# Patient Record
Sex: Female | Born: 1995
Health system: Southern US, Community
[De-identification: ages and names within clinical notes are randomized; demographics above are authoritative.]

## PROBLEM LIST (undated history)

## (undated) DIAGNOSIS — J302 Other seasonal allergic rhinitis: Secondary | ICD-10-CM

## (undated) DIAGNOSIS — Z8701 Personal history of pneumonia (recurrent): Secondary | ICD-10-CM

---
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
## 2015-03-15 ENCOUNTER — Ambulatory Visit (INDEPENDENT_AMBULATORY_CARE_PROVIDER_SITE_OTHER): Payer: 59 | Admitting: Sports Medicine

## 2015-03-15 ENCOUNTER — Encounter: Payer: Self-pay | Admitting: Sports Medicine

## 2015-03-15 VITALS — BP 142/85 | HR 84 | Ht 67.0 in | Wt 187.0 lb

## 2015-03-15 DIAGNOSIS — R635 Abnormal weight gain: Secondary | ICD-10-CM

## 2015-03-15 DIAGNOSIS — Z6829 Body mass index (BMI) 29.0-29.9, adult: Secondary | ICD-10-CM | POA: Insufficient documentation

## 2015-03-15 MED ORDER — PHENTERMINE HCL 37.5 MG PO TABS: ORAL_TABLET | ORAL | Status: DC

## 2015-03-15 NOTE — Patient Instructions (Signed)
Goal heart rate is 160 beats per minute, keeping here for 30 minutes 5 times per week, may divide time between resistance training and aerobic exercise

## 2015-03-15 NOTE — Assessment & Plan Note (Signed)
Starting phentermine, nutrition referral. Next line return monthly for weight checks and refills, go weight is 140 Exercise prescription given

## 2015-03-15 NOTE — Progress Notes (Signed)
  Subjective:    CC: Obesity  HPI: This is a pleasant 19 year old female, she has tried dieting, exercise, and continues to have great difficulty losing weight. At this point she is amenable to trying pharmacologic intervention.  Past medical history, Surgical history, Family history not pertinant except as noted below, Social history, Allergies, and medications have been entered into the medical record, reviewed, and no changes needed.   Review of Systems: No fevers, chills, night sweats, weight loss, chest pain, or shortness of breath.   Objective:    General: Well Developed, well nourished, and in no acute distress.  Neuro: Alert and oriented x3, extra-ocular muscles intact, sensation grossly intact.  HEENT: Normocephalic, atraumatic, pupils equal round reactive to light, neck supple, no masses, no lymphadenopathy, thyroid nonpalpable.  Skin: Warm and dry, no rashes. Cardiac: Regular rate and rhythm, no murmurs rubs or gallops, no lower extremity edema.  Respiratory: Clear to auscultation bilaterally. Not using accessory muscles, speaking in full sentences.  Impression and Recommendations:    I spent 25 minutes with this patient, greater than 50% was face-to-face time counseling regarding the above diagnoses

## 2015-04-16 ENCOUNTER — Encounter: Payer: Self-pay | Admitting: Sports Medicine

## 2015-04-16 ENCOUNTER — Ambulatory Visit (INDEPENDENT_AMBULATORY_CARE_PROVIDER_SITE_OTHER): Payer: 59 | Admitting: Sports Medicine

## 2015-04-16 VITALS — BP 145/74 | HR 101 | Ht 67.0 in | Wt 178.0 lb

## 2015-04-16 DIAGNOSIS — R635 Abnormal weight gain: Secondary | ICD-10-CM

## 2015-04-16 MED ORDER — PHENTERMINE HCL 37.5 MG PO TABS: ORAL_TABLET | ORAL | Status: DC

## 2015-04-16 NOTE — Progress Notes (Signed)
  Subjective:    CC: follow-up  HPI: Weight gain: 10 pound weight loss after the first month on phentermine, has not yet started seeing the nutritionist, no side effects, happy with the results so far.  Past medical history, Surgical history, Family history not pertinant except as noted below, Social history, Allergies, and medications have been entered into the medical record, reviewed, and no changes needed.   Review of Systems: No fevers, chills, night sweats, weight loss, chest pain, or shortness of breath.   Objective:    General: Well Developed, well nourished, and in no acute distress.  Neuro: Alert and oriented x3, extra-ocular muscles intact, sensation grossly intact.  HEENT: Normocephalic, atraumatic, pupils equal round reactive to light, neck supple, no masses, no lymphadenopathy, thyroid nonpalpable.  Skin: Warm and dry, no rashes. Cardiac: Regular rate and rhythm, no murmurs rubs or gallops, no lower extremity edema.  Respiratory: Clear to auscultation bilaterally. Not using accessory muscles, speaking in full sentences.  Impression and Recommendations:

## 2015-04-16 NOTE — Assessment & Plan Note (Signed)
Fantastic weight loss. Return in one month for recheck and refills. This will be the second month.

## 2015-05-16 ENCOUNTER — Encounter: Payer: Self-pay | Admitting: Sports Medicine

## 2015-05-16 ENCOUNTER — Ambulatory Visit (INDEPENDENT_AMBULATORY_CARE_PROVIDER_SITE_OTHER): Payer: 59 | Admitting: Sports Medicine

## 2015-05-16 DIAGNOSIS — R635 Abnormal weight gain: Secondary | ICD-10-CM | POA: Diagnosis not present

## 2015-05-16 MED ORDER — TOPIRAMATE 50 MG PO TABS: ORAL_TABLET | ORAL | Status: DC

## 2015-05-16 MED ORDER — PHENTERMINE HCL 37.5 MG PO TABS: ORAL_TABLET | ORAL | Status: DC

## 2015-05-16 NOTE — Patient Instructions (Signed)
[facility] at [phone] and [phone]

## 2015-05-16 NOTE — Assessment & Plan Note (Signed)
Good continue weight loss after the second month on phentermine. I'm going to add Topamax and we continue phentermine as we enter the third month. Has not yet started with nutritionist.

## 2015-05-16 NOTE — Progress Notes (Signed)
  Subjective:    CC: Weight check  HPI: Obesity: 10 pounds the first month, 5 pounds this last month, did have some family stressors. Also started school. She did lose weight, and has no side effects and would like to continue the medication.   Past medical history, Surgical history, Family history not pertinant except as noted below, Social history, Allergies, and medications have been entered into the medical record, reviewed, and no changes needed.   Review of Systems: No fevers, chills, night sweats, weight loss, chest pain, or shortness of breath.   Objective:    General: Well Developed, well nourished, and in no acute distress.  Neuro: Alert and oriented x3, extra-ocular muscles intact, sensation grossly intact.  HEENT: Normocephalic, atraumatic, pupils equal round reactive to light, neck supple, no masses, no lymphadenopathy, thyroid nonpalpable.  Skin: Warm and dry, no rashes. Cardiac: Regular rate and rhythm, no murmurs rubs or gallops, no lower extremity edema.  Respiratory: Clear to auscultation bilaterally. Not using accessory muscles, speaking in full sentences.  Impression and Recommendations:

## 2015-06-03 ENCOUNTER — Telehealth: Payer: 59 | Admitting: Family

## 2015-06-03 DIAGNOSIS — R6889 Other general symptoms and signs: Secondary | ICD-10-CM | POA: Diagnosis not present

## 2015-06-03 NOTE — Progress Notes (Signed)

## 2015-06-06 ENCOUNTER — Telehealth: Payer: 59 | Admitting: Family

## 2015-06-06 ENCOUNTER — Encounter: Payer: Self-pay | Admitting: Sports Medicine

## 2015-06-06 DIAGNOSIS — J019 Acute sinusitis, unspecified: Secondary | ICD-10-CM

## 2015-06-06 MED ORDER — AZITHROMYCIN 250 MG PO TABS: ORAL_TABLET | ORAL | Status: DC

## 2015-06-06 MED ORDER — FLUTICASONE PROPIONATE 50 MCG/ACT NA SUSP: NASAL | Status: AC

## 2015-06-06 NOTE — Progress Notes (Signed)
We are sorry that you are not feeling well.  Here is how we plan to help!  Based on what you have shared with me it looks like you have sinusitis.  Sinusitis is inflammation and infection in the sinus cavities of the head.  Based on your presentation I believe you most likely have Acute Bacterial sinusitis.  This is an infection caused by bacteria and is treated with antibiotics.  I have prescribed Azithromyin 250 mg: two tables now and then one tablet daily for 4 additonal days  You may use an oral decongestant such as Mucinex D or if you have glaucoma or high blood pressure use plain Mucinex.  Saline nasal sprays help an can sefely be used as often as needed for congestion.  If you develop worsening sinus pain, fever or notice severe headache and vision changes, or if symptoms are not better after completion of antibiotic, please schedule an appointment with a health care provider.  Sinus infections are not as easily transmitted as other respiratory infection, however we still recommend that you avoid close contact with loved ones, especially the very young and elderly.  Remember to wash your hands thoroughly throughout the day as this is the number one way to prevent the spread of infection!  Home Care:  Only take medications as instructed by your medical team.  Complete the entire course of an antibiotic.  Do not take these medications with alcohol.  A steam or ultrasonic humidifier can help congestion.  You can place a towel over your head and breathe in the steam from hot water coming from a faucet.  Avoid close contacts especially the very young and the elderly.  Cover your mouth when you cough or sneeze.  Always remember to wash your hands.  Get Help Right Away If:  You develop worsening fever or sinus pain.  You develop a severe head ache or visual changes.  Your symptoms persist after you have completed your treatment plan.  Make sure you  Understand these  instructions.  Will watch your condition.  Will get help right away if you are not doing well or get worse.  Your e-visit answers were reviewed by a board certified advanced clinical practitioner to complete your personal care plan.  Depending on the condition, your plan could have included both over the counter or prescription medications.  If there is a problem please reply  once you have received a response from your provider.  Your safety is important to us.  If you have drug allergies check your prescription carefully.    You can use MyChart to ask questions about today's visit, request a non-urgent call back, or ask for a work or school excuse.  You will get an e-mail in the next two days asking about your experience.  I hope that your e-visit has been valuable and will speed your recovery. Thank you for using e-visits.        

## 2015-06-13 ENCOUNTER — Encounter: Payer: Self-pay | Admitting: Sports Medicine

## 2015-06-13 ENCOUNTER — Ambulatory Visit (INDEPENDENT_AMBULATORY_CARE_PROVIDER_SITE_OTHER): Payer: 59 | Admitting: Sports Medicine

## 2015-06-13 VITALS — BP 128/76 | HR 76 | Ht 67.0 in | Wt 172.0 lb

## 2015-06-13 DIAGNOSIS — R635 Abnormal weight gain: Secondary | ICD-10-CM | POA: Diagnosis not present

## 2015-06-13 MED ORDER — PHENTERMINE HCL 37.5 MG PO TABS: ORAL_TABLET | ORAL | Status: DC

## 2015-06-13 MED ORDER — TOPIRAMATE 100 MG PO TABS: 100.0000 mg | ORAL_TABLET | Freq: Every day | ORAL | Status: DC

## 2015-06-13 NOTE — Assessment & Plan Note (Signed)
Minimal weight loss, we are entering the third month, refilling phentermine, Topamax, we will add Saxenda if insufficient weight loss by the next month.

## 2015-06-13 NOTE — Progress Notes (Signed)
  Subjective:    CC: weight check  HPI: After 2 months of phentermine she has only lost an additional pound, she is taking Topamax 50 mg daily. This point she wants to try single additional month before considering Saxenda. She did have an episode of sinusitis and blames this for her insufficient weight loss.  Past medical history, Surgical history, Family history not pertinant except as noted below, Social history, Allergies, and medications have been entered into the medical record, reviewed, and no changes needed.   Review of Systems: No fevers, chills, night sweats, weight loss, chest pain, or shortness of breath.   Objective:    General: Well Developed, well nourished, and in no acute distress.  Neuro: Alert and oriented x3, extra-ocular muscles intact, sensation grossly intact.  HEENT: Normocephalic, atraumatic, pupils equal round reactive to light, neck supple, no masses, no lymphadenopathy, thyroid nonpalpable.  Skin: Warm and dry, no rashes. Cardiac: Regular rate and rhythm, no murmurs rubs or gallops, no lower extremity edema.  Respiratory: Clear to auscultation bilaterally. Not using accessory muscles, speaking in full sentences.  Impression and Recommendations:

## 2015-07-15 ENCOUNTER — Ambulatory Visit: Payer: 59 | Admitting: Sports Medicine

## 2015-07-24 ENCOUNTER — Ambulatory Visit (INDEPENDENT_AMBULATORY_CARE_PROVIDER_SITE_OTHER): Payer: 59 | Admitting: Sports Medicine

## 2015-07-24 VITALS — BP 130/81 | HR 69 | Temp 97.9°F | Resp 18 | Wt 167.9 lb

## 2015-07-24 DIAGNOSIS — R635 Abnormal weight gain: Secondary | ICD-10-CM

## 2015-07-24 MED ORDER — PHENTERMINE HCL 37.5 MG PO TABS: ORAL_TABLET | ORAL | Status: DC

## 2015-07-24 MED ORDER — TOPIRAMATE 100 MG PO TABS: 150.0000 mg | ORAL_TABLET | Freq: Every day | ORAL | Status: DC

## 2015-07-24 NOTE — Assessment & Plan Note (Signed)
5 pound weight loss after the last month, we have not done a full 3 months of phentermine. Increasing Topamax to 100 mg, refilling phentermine, if less than 7-10 pounds of weight loss at the next month we will add Saxenda.

## 2015-07-24 NOTE — Progress Notes (Signed)
  Subjective:    CC: Weight check  HPI: After 3 months of phentermine she has lost an additional 5 pounds since last month, currently doing 100 mg of Topamax as well. No side effects, she does not yet want to start Saxenda.  Past medical history, Surgical history, Family history not pertinant except as noted below, Social history, Allergies, and medications have been entered into the medical record, reviewed, and no changes needed.   Review of Systems: No fevers, chills, night sweats, weight loss, chest pain, or shortness of breath.   Objective:    General: Well Developed, well nourished, and in no acute distress.  Neuro: Alert and oriented x3, extra-ocular muscles intact, sensation grossly intact.  HEENT: Normocephalic, atraumatic, pupils equal round reactive to light, neck supple, no masses, no lymphadenopathy, thyroid nonpalpable.  Skin: Warm and dry, no rashes. Cardiac: Regular rate and rhythm, no murmurs rubs or gallops, no lower extremity edema.  Respiratory: Clear to auscultation bilaterally. Not using accessory muscles, speaking in full sentences.  Impression and Recommendations:

## 2015-08-21 ENCOUNTER — Ambulatory Visit (INDEPENDENT_AMBULATORY_CARE_PROVIDER_SITE_OTHER): Payer: 59 | Admitting: Sports Medicine

## 2015-08-21 DIAGNOSIS — R635 Abnormal weight gain: Secondary | ICD-10-CM

## 2015-08-21 MED ORDER — PHENTERMINE HCL 37.5 MG PO TABS: ORAL_TABLET | ORAL | Status: DC

## 2015-08-21 MED ORDER — LIRAGLUTIDE -WEIGHT MANAGEMENT 18 MG/3ML ~~LOC~~ SOPN: 3.0000 mg | PEN_INJECTOR | Freq: Every day | SUBCUTANEOUS | Status: DC

## 2015-08-21 MED ORDER — TOPIRAMATE 100 MG PO TABS: 150.0000 mg | ORAL_TABLET | Freq: Every day | ORAL | Status: DC

## 2015-08-21 NOTE — Assessment & Plan Note (Signed)
Insufficient weight loss. Starting Saxenda, return in a nurse visit to learn injections, refilling phentermine and Topamax, we're entering the fourth month

## 2015-08-21 NOTE — Progress Notes (Signed)
  Subjective:    CC: Abnormal weight gain  HPI: This is a pleasant 20 year old female, after 3 months of phentermine she's lost some weight, but unfortunately has not lost any weight since the last visit. At the last visit we discussed adding Saxenda. She is agreeable to do this now.  Past medical history, Surgical history, Family history not pertinant except as noted below, Social history, Allergies, and medications have been entered into the medical record, reviewed, and no changes needed.   Review of Systems: No fevers, chills, night sweats, weight loss, chest pain, or shortness of breath.   Objective:    General: Well Developed, well nourished, and in no acute distress.  Neuro: Alert and oriented x3, extra-ocular muscles intact, sensation grossly intact.  HEENT: Normocephalic, atraumatic, pupils equal round reactive to light, neck supple, no masses, no lymphadenopathy, thyroid nonpalpable.  Skin: Warm and dry, no rashes. Cardiac: Regular rate and rhythm, no murmurs rubs or gallops, no lower extremity edema.  Respiratory: Clear to auscultation bilaterally. Not using accessory muscles, speaking in full sentences.  Impression and Recommendations:

## 2015-08-22 MED FILL — PHENTERMINE 37.5 MG TABLET: 37.5 | 30 days supply | Qty: 30 | Fill #0

## 2015-08-22 MED FILL — TOPIRAMATE 100 MG TABLET: 100 | 30 days supply | Qty: 45 | Fill #0

## 2015-08-30 ENCOUNTER — Telehealth: Payer: Self-pay | Admitting: Sports Medicine

## 2015-08-30 NOTE — Telephone Encounter (Signed)
Received fax for prior authorization on Saxenda sent through cover my meds waiting on authorization. - CF °

## 2015-09-01 ENCOUNTER — Encounter: Payer: Self-pay | Admitting: Sports Medicine

## 2015-09-02 MED FILL — SAXENDA 18 MG/3 ML PEN: 18 | 30 days supply | Qty: 15 | Fill #0

## 2015-09-02 NOTE — Telephone Encounter (Signed)
Received fax from [employer] and Bernie Covey has been approved from 08/30/2015 - 12/28/2015. Case Number [id number]. Pharmacy has been notified. - CF

## 2015-09-03 MED FILL — NOVOFINE 30 NEEDLES: 30G X 8 MM | 90 days supply | Qty: 100 | Fill #0

## 2015-09-19 ENCOUNTER — Ambulatory Visit: Payer: 59 | Admitting: Sports Medicine

## 2015-09-20 ENCOUNTER — Telehealth: Payer: 59 | Admitting: Family

## 2015-09-20 DIAGNOSIS — A084 Viral intestinal infection, unspecified: Secondary | ICD-10-CM | POA: Diagnosis not present

## 2015-09-20 NOTE — Progress Notes (Signed)
We are sorry that you are not feeling well.  Here is how we plan to help!  Based on what you have shared with me it looks like you have Acute Infectious Diarrhea.  Most cases of acute diarrhea are due to infections with virus and bacteria and are self-limited conditions lasting less than 14 days.  For your symptoms you may take Imodium 2 mg tablets that are over the counter at your local pharmacy. Take two tablet now and then one after each loose stool up to 6 a day.  Antibiotics are not needed for most people with diarrhea.   HOME CARE  We recommend changing your diet to help with your symptoms for the next few days.  Drink plenty of fluids that contain water salt and sugar. Sports drinks such as Gatorade may help.   You may try broths, soups, bananas, applesauce, soft breads, mashed potatoes or crackers.   You are considered infectious for as long as the diarrhea continues. Hand washing or use of alcohol based hand sanitizers is recommend.  It is best to stay out of work or school until your symptoms stop.   GET HELP RIGHT AWAY  If you have dark yellow colored urine or do not pass urine frequently you should drink more fluids.    If your symptoms worsen   If you feel like you are going to pass out (faint)  You have a new problem  MAKE SURE YOU   Understand these instructions.  Will watch your condition.  Will get help right away if you are not doing well or get worse.  Your e-visit answers were reviewed by a board certified advanced clinical practitioner to complete your personal care plan.  Depending on the condition, your plan could have included both over the counter or prescription medications.  If there is a problem please reply  once you have received a response from your provider.  Your safety is important to us.  If you have drug allergies check your prescription carefully.    You can use MyChart to ask questions about today's visit, request a non-urgent call  back, or ask for a work or school excuse for 24 hours related to this e-Visit. If it has been greater than 24 hours you will need to follow up with your provider, or enter a new e-Visit to address those concerns.   You will get an e-mail in the next two days asking about your experience.  I hope that your e-visit has been valuable and will speed your recovery. Thank you for using e-visits.  

## 2015-09-26 ENCOUNTER — Ambulatory Visit (INDEPENDENT_AMBULATORY_CARE_PROVIDER_SITE_OTHER): Payer: 59 | Admitting: Sports Medicine

## 2015-09-26 ENCOUNTER — Encounter: Payer: Self-pay | Admitting: Sports Medicine

## 2015-09-26 DIAGNOSIS — R635 Abnormal weight gain: Secondary | ICD-10-CM | POA: Diagnosis not present

## 2015-09-26 MED ORDER — ONDANSETRON 8 MG PO TBDP: 8.0000 mg | ORAL_TABLET | Freq: Three times a day (TID) | ORAL | Status: DC | PRN

## 2015-09-26 MED ORDER — TOPIRAMATE 100 MG PO TABS: 150.0000 mg | ORAL_TABLET | Freq: Every day | ORAL | Status: DC

## 2015-09-26 MED ORDER — PHENTERMINE HCL 37.5 MG PO TABS: ORAL_TABLET | ORAL | Status: DC

## 2015-09-26 MED FILL — ONDANSETRON ODT 8 MG TABLET: 8 | 7 days supply | Qty: 20 | Fill #0

## 2015-09-26 NOTE — Progress Notes (Signed)
  Subjective:    CC:  Weight check  HPI: Rebecca Hawkins returns, she is now finishing 5 months of phentermine, Saxenda, Topamax, she is having severe nausea with the Saxenda. She has not lost any weight but attributes this to increasing stress and exams.  Past medical history, Surgical history, Family history not pertinant except as noted below, Social history, Allergies, and medications have been entered into the medical record, reviewed, and no changes needed.   Review of Systems: No fevers, chills, night sweats, weight loss, chest pain, or shortness of breath.   Objective:    General: Well Developed, well nourished, and in no acute distress.  Neuro: Alert and oriented x3, extra-ocular muscles intact, sensation grossly intact.  HEENT: Normocephalic, atraumatic, pupils equal round reactive to light, neck supple, no masses, no lymphadenopathy, thyroid nonpalpable.  Skin: Warm and dry, no rashes. Cardiac: Regular rate and rhythm, no murmurs rubs or gallops, no lower extremity edema.  Respiratory: Clear to auscultation bilaterally. Not using accessory muscles, speaking in full sentences.  Impression and Recommendations:

## 2015-09-26 NOTE — Addendum Note (Signed)
Addended by: Baird Kay on: 09/26/2015 04:20 PM   Modules accepted: Medications

## 2015-09-26 NOTE — Assessment & Plan Note (Signed)
Refilling phentermine, Topamax, continue Saxenda. Adding Zofran for nausea.

## 2015-10-02 MED FILL — PHENTERMINE 37.5 MG TABLET: 37.5 | 30 days supply | Qty: 30 | Fill #0

## 2015-10-24 ENCOUNTER — Ambulatory Visit: Payer: 59 | Admitting: Sports Medicine

## 2015-10-31 ENCOUNTER — Ambulatory Visit: Payer: 59 | Admitting: Sports Medicine

## 2015-11-07 ENCOUNTER — Ambulatory Visit (INDEPENDENT_AMBULATORY_CARE_PROVIDER_SITE_OTHER): Payer: 59 | Admitting: Sports Medicine

## 2015-11-07 ENCOUNTER — Encounter: Payer: Self-pay | Admitting: Sports Medicine

## 2015-11-07 VITALS — BP 153/83 | HR 97 | Ht 67.0 in | Wt 169.0 lb

## 2015-11-07 DIAGNOSIS — Z6827 Body mass index (BMI) 27.0-27.9, adult: Secondary | ICD-10-CM

## 2015-11-07 DIAGNOSIS — R740 Nonspecific elevation of levels of transaminase and lactic acid dehydrogenase [LDH]: Secondary | ICD-10-CM

## 2015-11-07 DIAGNOSIS — R7401 Elevation of levels of liver transaminase levels: Secondary | ICD-10-CM

## 2015-11-07 MED ORDER — NALTREXONE-BUPROPION HCL ER 8-90 MG PO TB12: ORAL_TABLET | ORAL | Status: DC

## 2015-11-07 MED ORDER — NALTREXONE-BUPROPION HCL ER 8-90 MG PO TB12: 2.0000 | ORAL_TABLET | Freq: Two times a day (BID) | ORAL | Status: DC

## 2015-11-07 NOTE — Progress Notes (Signed)
  Subjective:    CC: Follow-up  HPI: Overweight: After 5 months she has plateaued in terms of her weight loss on phentermine, Saxenda, Topamax, agreeable to discontinuing try something else.  Past medical history, Surgical history, Family history not pertinant except as noted below, Social history, Allergies, and medications have been entered into the medical record, reviewed, and no changes needed.   Review of Systems: No fevers, chills, night sweats, weight loss, chest pain, or shortness of breath.   Objective:    General: Well Developed, well nourished, and in no acute distress.  Neuro: Alert and oriented x3, extra-ocular muscles intact, sensation grossly intact.  HEENT: Normocephalic, atraumatic, pupils equal round reactive to light, neck supple, no masses, no lymphadenopathy, thyroid nonpalpable.  Skin: Warm and dry, no rashes. Cardiac: Regular rate and rhythm, no murmurs rubs or gallops, no lower extremity edema.  Respiratory: Clear to auscultation bilaterally. Not using accessory muscles, speaking in full sentences.  Impression and Recommendations:

## 2015-11-07 NOTE — Assessment & Plan Note (Signed)
Insufficient response to phentermine, Topamax, Saxenda together. Discontinuing all of the above and switching to Contrave, return in 2 months.

## 2015-11-07 NOTE — Assessment & Plan Note (Signed)
Trended down, no further intervention needed.

## 2015-11-12 ENCOUNTER — Encounter: Payer: Self-pay | Admitting: Sports Medicine

## 2015-11-13 ENCOUNTER — Telehealth: Payer: Self-pay | Admitting: *Deleted

## 2015-11-13 NOTE — Telephone Encounter (Signed)
Initiated PA for [employer]

## 2015-11-14 NOTE — Telephone Encounter (Signed)
contrave denied patient's BMI is 26.denial letter in providers box

## 2015-11-14 NOTE — Telephone Encounter (Signed)
Resubmit and use her vital signs from 03/15/2015. She has also developed patellofemoral syndrome and steatohepatitis due to excessive weight which can serve as confounding factors to allow coverage for a BMI less than 30 but greater than 27. Tell her not to pick up the 2 separate medications that we called in, and to try and get the contrave.

## 2015-11-19 NOTE — Telephone Encounter (Signed)
unfortunately Im not going to be able to do that. If you have any other concerns please see me

## 2015-11-20 NOTE — Telephone Encounter (Signed)
Ok will do. Called patient and left a voicemail

## 2015-11-20 NOTE — Telephone Encounter (Signed)
OK then just have her pick up the 2 separate rx's.

## 2015-11-22 ENCOUNTER — Encounter: Payer: Self-pay | Admitting: Sports Medicine

## 2016-01-02 ENCOUNTER — Ambulatory Visit: Payer: 59 | Admitting: Sports Medicine

## 2016-02-15 IMAGING — DX DG KNEE COMPLETE 4+V*R*
2 series · 2 of 2 positions shown · non-contrast
Comparison: None

CLINICAL DATA: Patellofemoral syndrome both knees

EXAM:
RIGHT KNEE - COMPLETE 4+ VIEW

[knee lat]
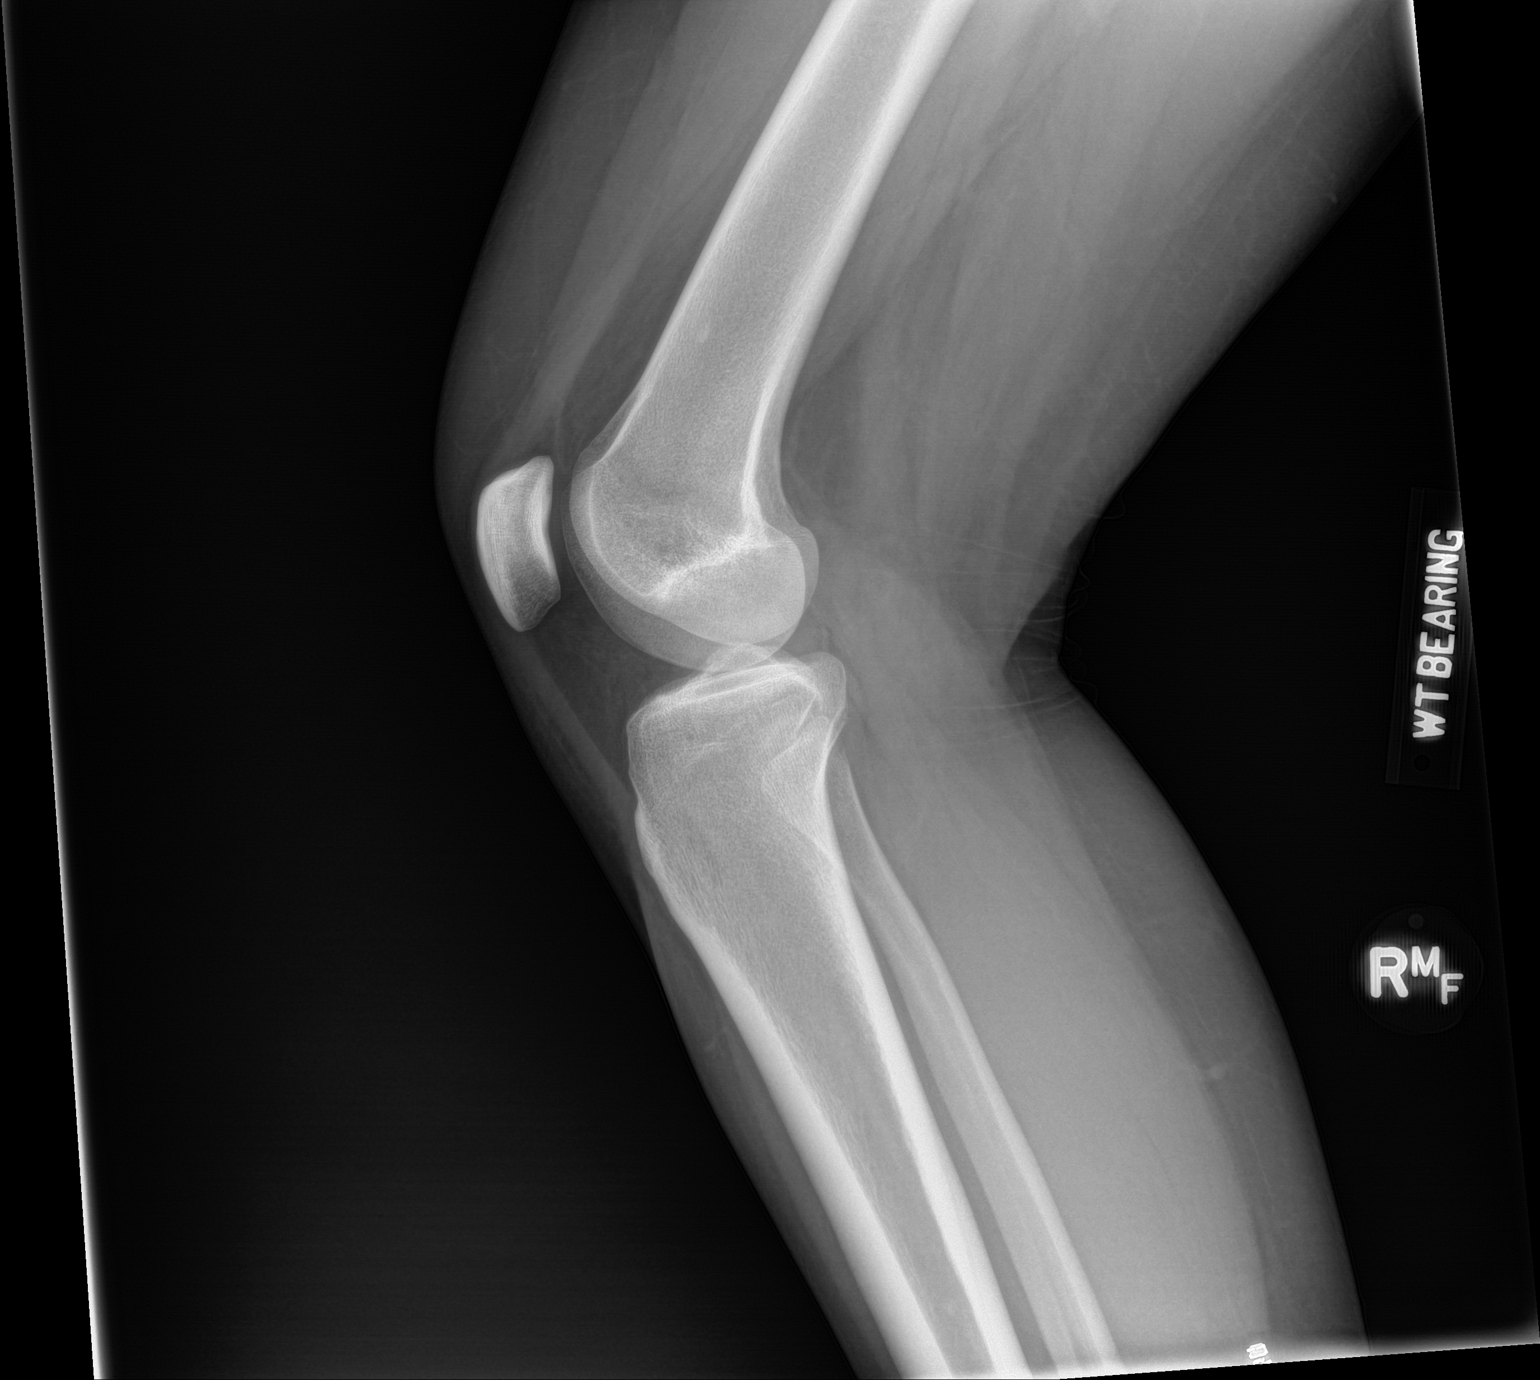

[patella skyline]
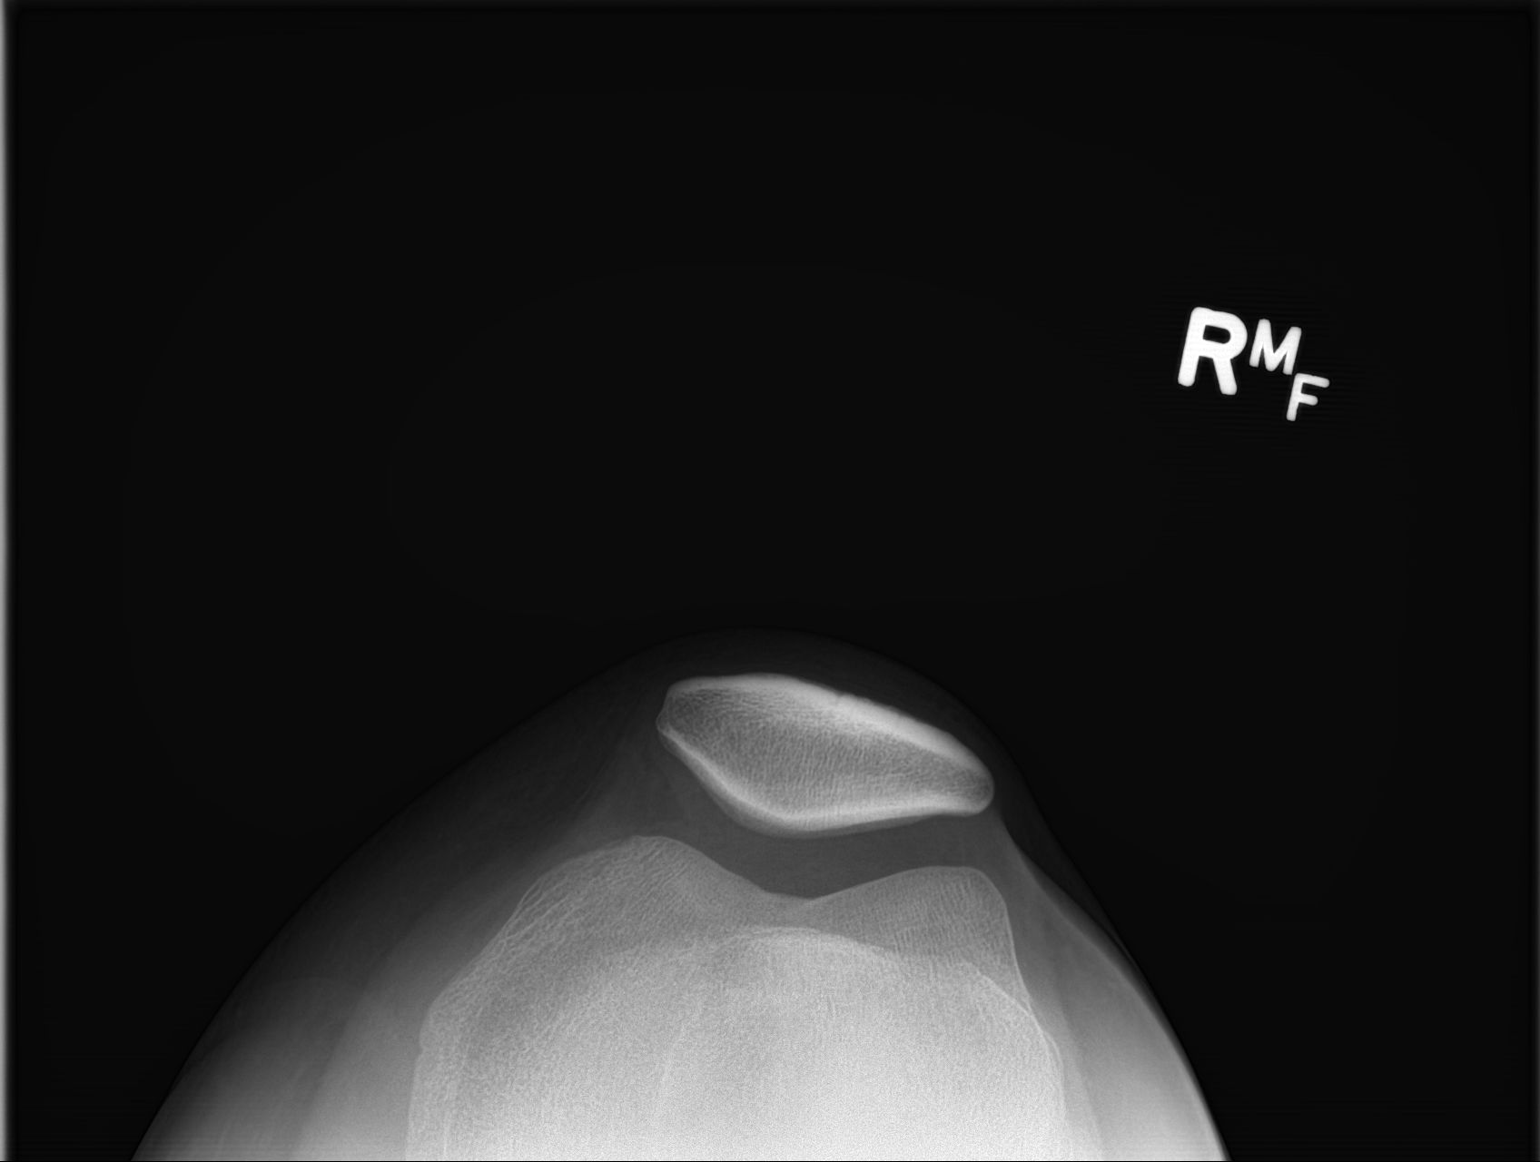

[2 of 2 positions shown; findings below may reference images not displayed]

FINDINGS: Osseous mineralization normal.

Joint spaces preserved.

No acute fracture, dislocation or bone destruction.

Patellofemoral alignment appears grossly normal.

No knee joint effusion.
IMPRESSION: Normal exam.

## 2016-12-15 ENCOUNTER — Ambulatory Visit (INDEPENDENT_AMBULATORY_CARE_PROVIDER_SITE_OTHER): Payer: 59 | Admitting: Sports Medicine

## 2016-12-15 ENCOUNTER — Encounter: Payer: Self-pay | Admitting: Sports Medicine

## 2016-12-15 DIAGNOSIS — Z0000 Encounter for general adult medical examination without abnormal findings: Secondary | ICD-10-CM | POA: Diagnosis not present

## 2016-12-15 DIAGNOSIS — F411 Generalized anxiety disorder: Secondary | ICD-10-CM | POA: Diagnosis not present

## 2016-12-15 DIAGNOSIS — Z6829 Body mass index (BMI) 29.0-29.9, adult: Secondary | ICD-10-CM

## 2016-12-15 DIAGNOSIS — H6123 Impacted cerumen, bilateral: Secondary | ICD-10-CM

## 2016-12-15 MED ORDER — PHENTERMINE-TOPIRAMATE ER 3.75-23 MG PO CP24: 1.0000 | ORAL_CAPSULE | Freq: Every morning | ORAL | 0 refills | Status: DC

## 2016-12-15 MED ORDER — BUSPIRONE HCL 7.5 MG PO TABS: 7.5000 mg | ORAL_TABLET | Freq: Three times a day (TID) | ORAL | 3 refills | Status: DC

## 2016-12-15 MED ORDER — PHENTERMINE-TOPIRAMATE ER 7.5-46 MG PO CP24: 1.0000 | ORAL_CAPSULE | Freq: Every morning | ORAL | 0 refills | Status: DC

## 2016-12-15 NOTE — Assessment & Plan Note (Signed)
Starting buspirone 7.5 twice a day, return in one month for a PHQ9 and GAD7.

## 2016-12-15 NOTE — Assessment & Plan Note (Addendum)
Patient is heavy enough now, adding Qsymia. Also has comorbidities of knee pain and elevated blood pressure on several occasions. Patient was counseled on 1500 calorie intake and exercise 3-4 times a week.

## 2016-12-15 NOTE — Assessment & Plan Note (Addendum)
Unremarkable physical, checking routine blood work, return in one year for this.

## 2016-12-15 NOTE — Progress Notes (Signed)
  Subjective:    CC: Annual physical  HPI:  Obesity: Has gained some more weight, desires to retry getting weight loss medication approved.  Anxiety:  Moderate to severe symptoms, would like to try medication. No suicidal or homicidal ideation.  Past medical history:  Negative.  See flowsheet/record as well for more information.  Surgical history: Negative.  See flowsheet/record as well for more information.  Family history: Negative.  See flowsheet/record as well for more information.  Social history: Negative.  See flowsheet/record as well for more information.  Allergies, and medications have been entered into the medical record, reviewed, and no changes needed.    Review of Systems: No headache, visual changes, nausea, vomiting, diarrhea, constipation, dizziness, abdominal pain, skin rash, fevers, chills, night sweats, swollen lymph nodes, weight loss, chest pain, body aches, joint swelling, muscle aches, shortness of breath, mood changes, visual or auditory hallucinations.  Objective:    General: Well Developed, well nourished, and in no acute distress.  Neuro: Alert and oriented x3, extra-ocular muscles intact, sensation grossly intact. Cranial nerves II through XII are intact, motor, sensory, and coordinative functions are all intact. HEENT: Normocephalic, atraumatic, pupils equal round reactive to light, neck supple, no masses, no lymphadenopathy, thyroid nonpalpable. Oropharynx, nasopharynx, external ear canals are occluded with cerumen  Skin: Warm and dry, no rashes noted.  Cardiac: Regular rate and rhythm, no murmurs rubs or gallops.  Respiratory: Clear to auscultation bilaterally. Not using accessory muscles, speaking in full sentences.  Abdominal: Soft, nontender, nondistended, positive bowel sounds, no masses, no organomegaly.  Musculoskeletal: Shoulder, elbow, wrist, hip, knee, ankle stable, and with full range of motion.  Indication: Cerumen impaction of the  ear(s) Medical necessity statement: On physical examination, cerumen impairs clinically significant portions of the external auditory canal, and tympanic membrane. Noted obstructive, copious cerumen that cannot be removed without irrigation Consent: Discussed benefits and risks of procedure and verbal consent obtained Procedure: Patient was prepped for the procedure. Utilized an otoscope to assess and take note of the ear canal, the tympanic membrane, and the presence, amount, and placement of the cerumen. Gentle water irrigation was utilized to remove cerumen.  Post procedure examination: shows cerumen was completely removed. Patient tolerated procedure well. The patient is made aware that they may experience temporary vertigo, temporary hearing loss, and temporary discomfort. If these symptom last for more than 24 hours to call the clinic or proceed to the ED.  Impression and Recommendations:    The patient was counselled, risk factors were discussed, anticipatory guidance given.  Annual physical exam Unremarkable physical, checking routine blood work, return in one year for this.  BMI 29.0-29.9,adult Patient is heavy enough now, adding Qsymia. Also has comorbidities of knee pain and elevated blood pressure on several occasions.  Generalized anxiety disorder Starting buspirone 7.5 twice a day, return in one month for a PHQ9 and GAD7.

## 2016-12-16 ENCOUNTER — Encounter: Payer: Self-pay | Admitting: Sports Medicine

## 2016-12-16 LAB — CBC
HCT: 42.9 % (ref 35.0–45.0)
Hemoglobin: 13.8 g/dL (ref 11.7–15.5)
MCH: 28.5 pg (ref 27.0–33.0)
MCHC: 32.2 g/dL (ref 32.0–36.0)
MCV: 88.5 fL (ref 80.0–100.0)
MPV: 10.4 fL (ref 7.5–12.5)
Platelets: 383 K/uL (ref 140–400)
RBC: 4.85 MIL/uL (ref 3.80–5.10)
RDW: 13.5 % (ref 11.0–15.0)
WBC: 7.6 10*3/uL (ref 3.8–10.8)

## 2016-12-16 LAB — COMPREHENSIVE METABOLIC PANEL
ALT: 12 U/L (ref 6–29)
AST: 14 U/L (ref 10–30)
Albumin: 4.5 g/dL (ref 3.6–5.1)
Alkaline Phosphatase: 72 U/L (ref 33–115)
CO2: 22 mmol/L (ref 20–31)
Calcium: 9.8 mg/dL (ref 8.6–10.2)
Creat: 0.88 mg/dL (ref 0.50–1.10)
Glucose, Bld: 80 mg/dL (ref 65–99)
Sodium: 140 mmol/L (ref 135–146)
Total Bilirubin: 0.8 mg/dL (ref 0.2–1.2)

## 2016-12-16 LAB — LIPID PANEL W/REFLEX DIRECT LDL
Cholesterol: 181 mg/dL (ref <200)
HDL: 63 mg/dL (ref >50)
LDL-Cholesterol: 100 mg/dL — ABNORMAL HIGH
Non-HDL Cholesterol (Calc): 118 mg/dL (ref <130)
Total CHOL/HDL Ratio: 2.9 ratio (ref <5.0)
Triglycerides: 88 mg/dL (ref <150)

## 2016-12-16 LAB — COMPREHENSIVE METABOLIC PANEL WITH GFR
BUN: 15 mg/dL (ref 7–25)
Chloride: 107 mmol/L (ref 98–110)
Potassium: 4.7 mmol/L (ref 3.5–5.3)
Total Protein: 7.5 g/dL (ref 6.1–8.1)

## 2016-12-16 LAB — HEMOGLOBIN A1C
Hgb A1c MFr Bld: 5.1 % (ref <5.7)
Mean Plasma Glucose: 100 mg/dL

## 2016-12-16 LAB — TSH: TSH: 2.24 mIU/L

## 2016-12-16 LAB — HIV ANTIBODY (ROUTINE TESTING W REFLEX): HIV 1&2 Ab, 4th Generation: NONREACTIVE

## 2016-12-16 LAB — VITAMIN D 25 HYDROXY (VIT D DEFICIENCY, FRACTURES): Vit D, 25-Hydroxy: 16 ng/mL — ABNORMAL LOW (ref 30–100)

## 2016-12-16 MED ORDER — VITAMIN D (ERGOCALCIFEROL) 1.25 MG (50000 UNIT) PO CAPS: 50000.0000 [IU] | ORAL_CAPSULE | ORAL | 0 refills | Status: AC

## 2016-12-16 MED FILL — busPIRone HCL 7.5 MG TABS: 7.5 | 20 days supply | Qty: 60 | Fill #0

## 2016-12-16 MED FILL — VIT D2 1.25 MG (50,000 UNIT: 1.25 MG | 56 days supply | Qty: 8 | Fill #0

## 2016-12-16 NOTE — Addendum Note (Signed)
Addended by: Monica BectonHEKKEKANDAM, THOMAS J on: 12/16/2016 08:57 AM   Modules accepted: Orders

## 2016-12-18 ENCOUNTER — Telehealth: Payer: Self-pay | Admitting: *Deleted

## 2016-12-18 NOTE — Telephone Encounter (Signed)
Pre Authorization sent to cover my meds. For Qsymia 7.5-4.6mg . The lower strength should be covered by the coupon card since the first 2 weeks are free with the card.

## 2016-12-29 NOTE — Telephone Encounter (Signed)
Received fax from [employer] and they denied coverage on Qsymia due to patient must have hypertension or type 2 diabetes and must have evidence of active enrollment in a exercise and caloric reduction program. - CF  PA reference number: [id number]

## 2017-01-21 MED FILL — busPIRone HCL 7.5 MG TABS: 7.5 | 20 days supply | Qty: 60 | Fill #1

## 2017-01-25 ENCOUNTER — Ambulatory Visit (INDEPENDENT_AMBULATORY_CARE_PROVIDER_SITE_OTHER): Payer: 59 | Admitting: Sports Medicine

## 2017-01-25 ENCOUNTER — Encounter: Payer: Self-pay | Admitting: Sports Medicine

## 2017-01-25 DIAGNOSIS — Z6829 Body mass index (BMI) 29.0-29.9, adult: Secondary | ICD-10-CM

## 2017-01-25 DIAGNOSIS — F411 Generalized anxiety disorder: Secondary | ICD-10-CM

## 2017-01-25 MED ORDER — BUSPIRONE HCL 15 MG PO TABS: 15.0000 mg | ORAL_TABLET | Freq: Three times a day (TID) | ORAL | 3 refills | Status: AC

## 2017-01-25 NOTE — Progress Notes (Signed)
  Subjective:    CC: Follow-up  HPI: Generalized anxiety disorder: Significantly better with BuSpar low-dose, no suicidal or homicidal ideation, depressive symptoms have also improved considerably.  Overweight: Has had great difficulty losing weight, has failed all of the medications, some have not been approved, she would like to just do it on her own for now.  Past medical history:  Negative.  See flowsheet/record as well for more information.  Surgical history: Negative.  See flowsheet/record as well for more information.  Family history: Negative.  See flowsheet/record as well for more information.  Social history: Negative.  See flowsheet/record as well for more information.  Allergies, and medications have been entered into the medical record, reviewed, and no changes needed.   Review of Systems: No fevers, chills, night sweats, weight loss, chest pain, or shortness of breath.   Objective:    General: Well Developed, well nourished, and in no acute distress.  Neuro: Alert and oriented x3, extra-ocular muscles intact, sensation grossly intact.  HEENT: Normocephalic, atraumatic, pupils equal round reactive to light, neck supple, no masses, no lymphadenopathy, thyroid nonpalpable.  Skin: Warm and dry, no rashes. Cardiac: Regular rate and rhythm, no murmurs rubs or gallops, no lower extremity edema.  Respiratory: Clear to auscultation bilaterally. Not using accessory muscles, speaking in full sentences.  Impression and Recommendations:    Generalized anxiety disorder Fantastic response, increasing BuSpar to 15 mg 3 times a day.  BMI 29.0-29.9,adult So far none of the medications have been covered, she is just going to do weight loss on her own from now on. Return as needed for this.  I spent 25 minutes with this patient, greater than 50% was face-to-face time counseling regarding the above diagnoses

## 2017-01-25 NOTE — Assessment & Plan Note (Signed)
So far none of the medications have been covered, she is just going to do weight loss on her own from now on. Return as needed for this.

## 2017-01-25 NOTE — Assessment & Plan Note (Signed)
Fantastic response, increasing BuSpar to 15 mg 3 times a day.

## 2017-02-22 ENCOUNTER — Ambulatory Visit: Payer: 59 | Admitting: Sports Medicine

## 2017-03-23 ENCOUNTER — Telehealth: Payer: Self-pay | Admitting: Sports Medicine

## 2017-03-23 NOTE — Telephone Encounter (Signed)
Pt sent a message via mychart. She is going to [place] on [place]th for 3 mnths and wants to make sure she is up to date on her vaccines. She had a Physical on May 8th of this year. Thank you.

## 2017-03-25 NOTE — Telephone Encounter (Signed)
Left Vm with information and call back #.

## 2017-03-25 NOTE — Telephone Encounter (Signed)
She due for a TDap

## 2017-04-08 ENCOUNTER — Telehealth: Payer: Self-pay | Admitting: Sports Medicine

## 2017-04-08 NOTE — Telephone Encounter (Signed)
Patient called requesting to get a Tetanus shot for school and a copy of her immunization record. She is scheduled for 04/14/17 at 1:30 I just want to make sure she is okay to get this shot. We do not have a triage or free nurse avail. We have 3 nurses out sick on this day and both triage and andrea covering providers busy. Thanks

## 2017-04-09 NOTE — Telephone Encounter (Signed)
Yes this is okay 

## 2017-04-14 ENCOUNTER — Ambulatory Visit (INDEPENDENT_AMBULATORY_CARE_PROVIDER_SITE_OTHER): Payer: 59 | Admitting: Family Medicine

## 2017-04-14 VITALS — BP 126/70 | HR 68 | Temp 98.7°F

## 2017-04-14 DIAGNOSIS — Z23 Encounter for immunization: Secondary | ICD-10-CM | POA: Diagnosis not present

## 2017-04-14 NOTE — Progress Notes (Signed)
   Subjective:    Patient ID: Rebecca Hawkins, female    DOB: 11/02/1995, 21 y.o.   MRN: [id number]  HPI  Rebecca Hawkins is here for a flu and T-dap vaccine.   Review of Systems     Objective:   Physical Exam        Assessment & Plan:  Need immunizations - Patient tolerated injection well without complications.

## 2017-12-28 ENCOUNTER — Encounter: Payer: Self-pay | Admitting: Sports Medicine

## 2017-12-28 ENCOUNTER — Ambulatory Visit (INDEPENDENT_AMBULATORY_CARE_PROVIDER_SITE_OTHER): Payer: No Typology Code available for payment source | Admitting: Sports Medicine

## 2017-12-28 ENCOUNTER — Other Ambulatory Visit (HOSPITAL_COMMUNITY)
Admission: RE | Admit: 2017-12-28 | Discharge: 2017-12-28 | Disposition: A | Discharge status: Home / Self Care | Payer: No Typology Code available for payment source | Source: Ambulatory Visit | Attending: Sports Medicine | Admitting: Sports Medicine

## 2017-12-28 DIAGNOSIS — Z0000 Encounter for general adult medical examination without abnormal findings: Secondary | ICD-10-CM

## 2017-12-28 NOTE — Assessment & Plan Note (Signed)
Physical as above with Pap. Ordering routine labs.

## 2017-12-28 NOTE — Progress Notes (Addendum)
Subjective:    CC: Annual physical.   HPI:  Rebecca Hawkins is here for a physical, Pap, she has no complaints.  She just graduated from college.  I reviewed the past medical history, family history, social history, surgical history, and allergies today and no changes were needed.  Please see the problem list section below in epic for further details.  Past Medical History: Past Medical History:  Diagnosis Date  . History of pneumonia   . Seasonal allergies    Past Surgical History: No past surgical history on file. Social History: Social History   Socioeconomic History  . Marital status: Single    Spouse name: Not on file  . Number of children: Not on file  . Years of education: Not on file  . Highest education level: Not on file  Occupational History  . Occupation: Consulting civil engineer  Social Needs  . Financial resource strain: Not on file  . Food insecurity:    Worry: Not on file    Inability: Not on file  . Transportation needs:    Medical: Not on file    Non-medical: Not on file  Tobacco Use  . Smoking status: Never Smoker  . Smokeless tobacco: Never Used  Substance and Sexual Activity  . Alcohol use: No  . Drug use: No  . Sexual activity: Never  Lifestyle  . Physical activity:    Days per week: Not on file    Minutes per session: Not on file  . Stress: Not on file  Relationships  . Social connections:    Talks on phone: Not on file    Gets together: Not on file    Attends religious service: Not on file    Active member of club or organization: Not on file    Attends meetings of clubs or organizations: Not on file    Relationship status: Not on file  Other Topics Concern  . Not on file  Social History Narrative  . Not on file   Family History: Family History  Problem Relation Age of Onset  . Diabetes Paternal Uncle   . Cancer Paternal Grandmother        breast  . Heart disease Maternal Grandmother   . Heart disease Paternal Grandmother   . Heart disease Maternal  Grandfather   . Heart disease Paternal Grandfather   . Hypertension Mother   . Hypertension Mother   . Hypertension Maternal Grandmother   . Hypertension Maternal Grandfather   . Hypertension Paternal Grandmother   . Hypertension Paternal Grandfather    Allergies: No Known Allergies Medications: See med rec.  Review of Systems: No headache, visual changes, nausea, vomiting, diarrhea, constipation, dizziness, abdominal pain, skin rash, fevers, chills, night sweats, swollen lymph nodes, weight loss, chest pain, body aches, joint swelling, muscle aches, shortness of breath, mood changes, visual or auditory hallucinations.  Objective:    General: Well Developed, well nourished, and in no acute distress.  Neuro: Alert and oriented x3, extra-ocular muscles intact, sensation grossly intact. Cranial nerves II through XII are intact, motor, sensory, and coordinative functions are all intact. HEENT: Normocephalic, atraumatic, pupils equal round reactive to light, neck supple, no masses, no lymphadenopathy, thyroid nonpalpable. Oropharynx, nasopharynx, external ear canals are unremarkable. Skin: Warm and dry, no rashes noted.  Cardiac: Regular rate and rhythm, no murmurs rubs or gallops.  Respiratory: Clear to auscultation bilaterally. Not using accessory muscles, speaking in full sentences.  Abdominal: Soft, nontender, nondistended, positive bowel sounds, no masses, no organomegaly.  Musculoskeletal: Shoulder,  elbow, wrist, hip, knee, ankle stable, and with full range of motion. Genital: Normal labia, vagina, cervix.  Pap performed.  Chaperoned by Donzetta Sprung, CMA.  Impression and Recommendations:    The patient was counselled, risk factors were discussed, anticipatory guidance given.  Annual physical exam Physical as above with Pap. Ordering routine labs.  ___________________________________________ Ihor Austin. Benjamin Stain, M.D., ABFM., CAQSM. Primary Care and Sports Medicine [facility]  Adjunct Instructor of Family Medicine  [employer]

## 2017-12-29 LAB — CBC
HCT: 41.2 % (ref 35.0–45.0)
Hemoglobin: 13.6 g/dL (ref 11.7–15.5)
MCH: 28.6 pg (ref 27.0–33.0)
MCHC: 33 g/dL (ref 32.0–36.0)
MCV: 86.7 fL (ref 80.0–100.0)
MPV: 10.5 fL (ref 7.5–12.5)
Platelets: 382 10*3/uL (ref 140–400)
RBC: 4.75 10*6/uL (ref 3.80–5.10)
RDW: 12.9 % (ref 11.0–15.0)
WBC: 8.5 Thousand/uL (ref 3.8–10.8)

## 2017-12-29 LAB — COMPREHENSIVE METABOLIC PANEL WITH GFR
AG Ratio: 1.6 (calc) (ref 1.0–2.5)
Albumin: 4.6 g/dL (ref 3.6–5.1)
BUN: 15 mg/dL (ref 7–25)
CO2: 25 mmol/L (ref 20–32)
Calcium: 10.2 mg/dL (ref 8.6–10.2)
Potassium: 4.8 mmol/L (ref 3.5–5.3)
Sodium: 138 mmol/L (ref 135–146)
Total Bilirubin: 0.6 mg/dL (ref 0.2–1.2)
Total Protein: 7.5 g/dL (ref 6.1–8.1)

## 2017-12-29 LAB — LIPID PANEL W/REFLEX DIRECT LDL
Cholesterol: 200 mg/dL — ABNORMAL HIGH (ref <200)
HDL: 62 mg/dL (ref >50)
LDL Cholesterol (Calc): 113 mg/dL (calc) — ABNORMAL HIGH
Non-HDL Cholesterol (Calc): 138 mg/dL (calc) — ABNORMAL HIGH (ref <130)
Total CHOL/HDL Ratio: 3.2 (calc) (ref <5.0)
Triglycerides: 139 mg/dL (ref <150)

## 2017-12-29 LAB — TSH: TSH: 2.5 m[IU]/L

## 2017-12-29 LAB — COMPREHENSIVE METABOLIC PANEL
ALT: 26 U/L (ref 6–29)
AST: 16 U/L (ref 10–30)
Alkaline phosphatase (APISO): 87 U/L (ref 33–115)
Chloride: 106 mmol/L (ref 98–110)
Creat: 0.92 mg/dL (ref 0.50–1.10)
Globulin: 2.9 g/dL (calc) (ref 1.9–3.7)
Glucose, Bld: 82 mg/dL (ref 65–99)

## 2017-12-29 LAB — HEMOGLOBIN A1C
Hgb A1c MFr Bld: 5.3 % of total Hgb (ref <5.7)
Mean Plasma Glucose: 105 (calc)
eAG (mmol/L): 5.8 (calc)

## 2017-12-31 LAB — CYTOLOGY - PAP
Chlamydia: NEGATIVE
Diagnosis: NEGATIVE
HPV: NOT DETECTED
Neisseria Gonorrhea: NEGATIVE
